# Patient Record
Sex: Female | Born: 1935 | Race: White | Hispanic: Yes | Smoking: Never smoker
Health system: Southern US, Community
[De-identification: ages and names within clinical notes are randomized; demographics above are authoritative.]

## PROBLEM LIST (undated history)

## (undated) DIAGNOSIS — S3210XA Unspecified fracture of sacrum, initial encounter for closed fracture: Secondary | ICD-10-CM

## (undated) DIAGNOSIS — I10 Essential (primary) hypertension: Secondary | ICD-10-CM

## (undated) DIAGNOSIS — S329XXA Fracture of unspecified parts of lumbosacral spine and pelvis, initial encounter for closed fracture: Secondary | ICD-10-CM

## (undated) HISTORY — DX: Essential (primary) hypertension: I10

## (undated) HISTORY — PX: HIP FRACTURE SURGERY: SHX118

## (undated) HISTORY — DX: Unspecified fracture of sacrum, initial encounter for closed fracture: S32.10XA

## (undated) HISTORY — DX: Fracture of unspecified parts of lumbosacral spine and pelvis, initial encounter for closed fracture: S32.9XXA

---

## 2016-07-02 ENCOUNTER — Other Ambulatory Visit (HOSPITAL_COMMUNITY): Payer: Self-pay | Admitting: Internal Medicine

## 2016-07-02 DIAGNOSIS — R1319 Other dysphagia: Secondary | ICD-10-CM

## 2016-07-11 ENCOUNTER — Ambulatory Visit (HOSPITAL_COMMUNITY)
Admission: RE | Admit: 2016-07-11 | Discharge: 2016-07-11 | Disposition: A | Discharge status: Home / Self Care | Payer: Medicare Other | Source: Ambulatory Visit | Attending: Internal Medicine | Admitting: Internal Medicine

## 2016-07-11 DIAGNOSIS — I10 Essential (primary) hypertension: Secondary | ICD-10-CM | POA: Diagnosis not present

## 2016-07-11 DIAGNOSIS — G309 Alzheimer's disease, unspecified: Secondary | ICD-10-CM | POA: Diagnosis not present

## 2016-07-11 DIAGNOSIS — R1319 Other dysphagia: Secondary | ICD-10-CM

## 2016-07-11 DIAGNOSIS — F329 Major depressive disorder, single episode, unspecified: Secondary | ICD-10-CM | POA: Diagnosis not present

## 2016-07-11 DIAGNOSIS — F419 Anxiety disorder, unspecified: Secondary | ICD-10-CM | POA: Insufficient documentation

## 2016-07-11 DIAGNOSIS — Z8701 Personal history of pneumonia (recurrent): Secondary | ICD-10-CM | POA: Diagnosis not present

## 2016-07-11 DIAGNOSIS — F0280 Dementia in other diseases classified elsewhere without behavioral disturbance: Secondary | ICD-10-CM | POA: Diagnosis not present

## 2016-07-11 DIAGNOSIS — D649 Anemia, unspecified: Secondary | ICD-10-CM | POA: Diagnosis not present

## 2016-07-11 DIAGNOSIS — R1310 Dysphagia, unspecified: Secondary | ICD-10-CM | POA: Insufficient documentation

## 2016-07-11 DIAGNOSIS — F0390 Unspecified dementia without behavioral disturbance: Secondary | ICD-10-CM | POA: Diagnosis present

## 2016-07-11 NOTE — Progress Notes (Signed)
Modified Barium Swallow Progress Note  Patient Details  Name: Tamara BarefootGenoveva Christensen MRN: [id number] Date of Birth: 08-19-1923  Today's Date: 07/11/2016  Modified Barium Swallow completed.  Full report located under Chart Review in the Imaging Section.  Brief recommendations include the following:  Clinical Impression  Pt presents with a mild oral dysphagia, likely multifactorial in nature due to cognitive status as well as missing lower dentures. She has mildly prolonged mastication, reduced bolus cohesion, and oral clearance, although she swallows her oral residuals from soft solids with Mod I. Pt has intermittent flash penetration of thin liquids, which is not necessarily abnormal given her advanced age. Even when challenged with large, consecutive straw sips, there is no frank penetration or aspiration. Pt certainly seems appropriate for advancement of her solid foods. Would recommend a chopped or mechanical soft diet, but will defer to her primary SLP's discretion. Thin liquids could be continued, and would continu to crush her medications in puree as pt made no attempts to swallow a pill whole.    Swallow Evaluation Recommendations       SLP Diet Recommendations: Dysphagia 3 (Mech soft) solids;Dysphagia 2 (Fine chop) solids;Thin liquid   Liquid Administration via: Cup;Straw   Medication Administration: Crushed with puree   Supervision: Patient able to self feed;Full supervision/cueing for compensatory strategies   Compensations: Minimize environmental distractions;Slow rate;Small sips/bites   Postural Changes: Seated upright at 90 degrees   Oral Care Recommendations: Oral care BID        Maxcine Hamaiewonsky, Joaquim Tolen 07/11/2016,1:55 PM   Maxcine HamLaura Paiewonsky, M.A. CCC-SLP [phone]

## 2016-10-23 ENCOUNTER — Encounter: Payer: Self-pay | Admitting: Adult Health

## 2016-10-23 ENCOUNTER — Ambulatory Visit (INDEPENDENT_AMBULATORY_CARE_PROVIDER_SITE_OTHER): Payer: Medicare Other | Admitting: Adult Health

## 2016-10-23 VITALS — BP 125/67 | HR 71 | Wt 84.7 lb

## 2016-10-23 DIAGNOSIS — G308 Other Alzheimer's disease: Secondary | ICD-10-CM

## 2016-10-23 DIAGNOSIS — Z0000 Encounter for general adult medical examination without abnormal findings: Secondary | ICD-10-CM | POA: Diagnosis not present

## 2016-10-23 DIAGNOSIS — I10 Essential (primary) hypertension: Secondary | ICD-10-CM

## 2016-10-23 DIAGNOSIS — F0281 Dementia in other diseases classified elsewhere with behavioral disturbance: Secondary | ICD-10-CM

## 2016-10-23 DIAGNOSIS — F0280 Dementia in other diseases classified elsewhere without behavioral disturbance: Secondary | ICD-10-CM | POA: Insufficient documentation

## 2016-10-23 DIAGNOSIS — G309 Alzheimer's disease, unspecified: Secondary | ICD-10-CM

## 2016-10-23 NOTE — Patient Instructions (Addendum)
Alzheimer Disease Alzheimer disease is a brain disease that affects memory, thinking, and behavior. People with Alzheimer disease lose mental abilities, and the disease gets worse over time. Survival with Alzheimer disease ranges from several years to as long as 20 years. What are the causes? This condition develops when a protein called beta-amyloid forms deposits in the brain. It is not known what causes these deposits to form. What increases the risk? This condition is more likely to develop in people who:  Are elderly.  Have a family history of dementia.  Have had a brain injury.  Have heart or blood vessel disease.  Have had a stroke.  Have high blood pressure or high cholesterol.  Have diabetes. What are the signs or symptoms? Symptoms of this condition happen in three stages, which often overlap. Early stage In this stage, you may continue to be independent. You may still be able to drive, work, and be social. Symptoms in this stage include:  Minor memory problems, such as forgetting a name or what you read.  Difficulty with:  Paying attention.  Communicating.  Doing familiar tasks.  Learning new things.  Needing more time to do daily activities.  Anxiety.  Social withdrawal.  Loss of motivation. Moderate stage In this stage, you will start to need care. This stage usually lasts the longest. Symptoms in this stage include:  Difficulty with expressing thoughts.  Memory loss that affects daily life. This can include forgetting:  Your address or phone number.  Events that have happened.  Parts of your personal history, like where you went to school.  Confusion about where you are or what time it is.  Difficulty in judging distance.  Changes in personality, mood, and behavior. You may be moody, irritable, angry, frustrated, fearful, anxious, or suspicious.  Poor reasoning and judgment.  Delusions or hallucinations.  Changes in sleep  patterns.  Wandering and getting lost. Severe stage In the final stage, you will need help with your personal care and dailyactivities. Symptoms in this stage include:  Worsening memory loss.  Personality changes.  Loss of awareness of your surroundings.  Changes in physical abilities, including the ability to walk, sit, and swallow.  Difficulty in communicating.  Inability to control the bladder and bowels.  Increasing confusion.  Increasing disruptive behavior. How is this diagnosed? This condition is diagnosed with an assessment by your health care provider. During this assessment, your health care provider will talk with you and your family, friends, or caregivers about your symptoms. A thorough medical history will be taken, and you will have a physical exam and tests. Tests may include:  Lab tests, such as blood or urine tests.  Imaging tests, such as a CT scan, PET scan, or MRI.  A lumbar puncture. This test involves removing and testing a small amount of the fluid that surrounds the brain and spinal cord.  An electroencephalogram (EEG). In this test, small metal discs are used to measure electrical activity in the brain.  Memory tests, cognitive tests, and neuropsychological tests. These tests evaluate brain function. How is this treated? At this time, there is no treatment to cure Alzheimer disease or stop it from getting worse. The goals of treatment are:  To slow down the disease.  To manage behavioral problems.  To provide you with a safe environment.  To make life easier for you and your caregivers. The following treatment options are available:  Medicines. Medicines may help to slow down memory loss and control behavioral symptoms.    memory loss and control behavioral symptoms.  Talk therapy. Talk therapy provides you with education, support, and memory aids. It is most helpful in the early stages of the condition.  Counseling or spiritual guidance. It is normal to have a lot of feelings,  including anger, relief, fear, and isolation. Counseling and guidance can help you deal with these feelings.  Caregiving. This involves having caregivers help you with your daily activities. Caregivers may be family members, friends, or trained medical professionals. Caregiving can be done at home or outside the home.  Family support groups. These provide education, emotional support, and information about community resources to family members who are taking care of you.  Follow these instructions at home: Medicines  Take over-the-counter and prescription medicines only as told by your health care provider.  Avoid taking medicines that can affect thinking, such as pain or sleeping medicines. Lifestyle   Make healthy lifestyle choices: ? Be physically active as told by your health care provider. ? Do not use any tobacco products, such as cigarettes, chewing tobacco, and e-cigarettes. If you need help quitting, ask your health care provider. ? Eat a healthy diet. ? Practice stress-management techniques when you get stressed. ? Stay social.  Drink enough fluid to keep your urine clear or pale yellow.  Make sure to get quality sleep. These tips can help you get a good night's rest: ? Avoid napping during the day. ? Keep your sleeping area dark and cool. ? Avoid exercising during the few hours before you go to bed. ? Avoid caffeine products in the evening. General instructions  Work with your health care provider to determine what you need help with and what your safety needs are.  If you were given a bracelet that tracks your location, make sure to wear it.  Keep all follow-up visits as told by your health care provider. This is important.  If you have questions or would like additional support, you may contact The Alzheimer's Association: ? 24-hour helpline: 385-156-7366 ? Website: LimitLaws.hu Contact a health care provider if:  You have nausea, vomiting, or trouble with  eating.  You have dizziness, or weakness.  You have new or worsening trouble with sleeping.  You or your family members become concerned for your safety. Get help right away if:  You develop chest pain or difficulty with breathing.  You pass out. This information is not intended to replace advice given to you by your health care provider. Make sure you discuss any questions you have with your health care provider. Document Released: 09/13/2003 Document Revised: 09/02/2015 Document Reviewed: 09/29/2014 Elsevier Interactive Patient Education  2017 [employer].  Fall Prevention in the Home Falls can cause injuries and can affect people from all age groups. There are many simple things that you can do to make your home safe and to help prevent falls. What can I do on the outside of my home?  Regularly repair the edges of walkways and driveways and fix any cracks.  Remove high doorway thresholds.  Trim any shrubbery on the main path into your home.  Use bright outdoor lighting.  Clear walkways of debris and clutter, including tools and rocks.  Regularly check that handrails are securely fastened and in good repair. Both sides of any steps should have handrails.  Install guardrails along the edges of any raised decks or porches.  Have leaves, snow, and ice cleared regularly.  Use sand or salt on walkways during winter months.  In the garage, clean  up any spills right away, including grease or oil spills. What can I do in the bathroom?  Use night lights.  Install grab bars by the toilet and in the tub and shower. Do not use towel bars as grab bars.  Use non-skid mats or decals on the floor of the tub or shower.  If you need to sit down while you are in the shower, use a plastic, non-slip stool.  Keep the floor dry. Immediately clean up any water that spills on the floor.  Remove soap buildup in the tub or shower on a regular basis.  Attach bath mats securely with  double-sided non-slip rug tape.  Remove throw rugs and other tripping hazards from the floor. What can I do in the bedroom?  Use night lights.  Make sure that a bedside light is easy to reach.  Do not use oversized bedding that drapes onto the floor.  Have a firm chair that has side arms to use for getting dressed.  Remove throw rugs and other tripping hazards from the floor. What can I do in the kitchen?  Clean up any spills right away.  Avoid walking on wet floors.  Place frequently used items in easy-to-reach places.  If you need to reach for something above you, use a sturdy step stool that has a grab bar.  Keep electrical cables out of the way.  Do not use floor polish or wax that makes floors slippery. If you have to use wax, make sure that it is non-skid floor wax.  Remove throw rugs and other tripping hazards from the floor. What can I do in the stairways?  Do not leave any items on the stairs.  Make sure that there are handrails on both sides of the stairs. Fix handrails that are broken or loose. Make sure that handrails are as long as the stairways.  Check any carpeting to make sure that it is firmly attached to the stairs. Fix any carpet that is loose or worn.  Avoid having throw rugs at the top or bottom of stairways, or secure the rugs with carpet tape to prevent them from moving.  Make sure that you have a light switch at the top of the stairs and the bottom of the stairs. If you do not have them, have them installed. What are some other fall prevention tips?  Wear closed-toe shoes that fit well and support your feet. Wear shoes that have rubber soles or low heels.  When you use a stepladder, make sure that it is completely opened and that the sides are firmly locked. Have someone hold the ladder while you are using it. Do not climb a closed stepladder.  Add color or contrast paint or tape to grab bars and handrails in your home. Place contrasting color  strips on the first and last steps.  Use mobility aids as needed, such as canes, walkers, scooters, and crutches.  Turn on lights if it is dark. Replace any light bulbs that burn out.  Set up furniture so that there are clear paths. Keep the furniture in the same spot.  Fix any uneven floor surfaces.  Choose a carpet design that does not hide the edge of steps of a stairway.  Be aware of any and all pets.  Review your medicines with your healthcare provider. Some medicines can cause dizziness or changes in blood pressure, which increase your risk of falling. Talk with your health care provider about other ways that you can decrease  your risk of falls. This may include working with a physical therapist or trainer to improve your strength, balance, and endurance. This information is not intended to replace advice given to you by your health care provider. Make sure you discuss any questions you have with your health care provider. Document Released: 12/22/2001 Document Revised: 05/31/2015 Document Reviewed: 02/05/2014 Elsevier Interactive Patient Education  2017 [employer].  Please continue all medications as directed. Home Health care referral placed. Please make appt in next few weeks for fasting labs and complete physical. WELCOME TO THE PRACTICE!

## 2016-10-23 NOTE — Progress Notes (Signed)
Subjective:    Patient ID: Tamara Christensen, female    DOB: 1923-05-30, 83 y.o.   MRN: [id number]  HPI:  Tamara Christensen is here to establish as a new pt.  She is a 83 year old female. PMH: Dementia without behavioral disturbance, PNA, anxiety/depression, Alzheimer's, HTN, and anemia.  She underwent modified barium swallow study on 07/11/2016, due to difficulty swallowing pills.  Study revealed small amount of "flash penetration" with thin liquids and minimal post swallow retention with puree foods.   Referral to speech pathology placed.  All hx was obtained from pt's great niece/primary care giver/POA- "[facility]".   The patient was displaced from here home in [place] after [facility] 2017.  She initially moved to [place] and was house in nursing home, then moved down to [place] to be with Tamara Christensen and her family.  She briefly lived at [facility] then Tamara Christensen moved her in with and her husband.  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  She was dx'd with moderate dementia in 2017 and since then has stopped using [place] and only speaks in Spanish now. She has experienced a few falls in the last 1/2 year, most recently Friday- seen/cleared at [place] (no xray completed b/c pt was walking normally and denied any pain).  Tamara Christensen reports that Tamara Christensen has never used tobacco/excessive ETOH.  She is able to feed herself and can ambulate short distances.  She reports that her appetite is "decent, but she can't put on any wt".  [name] is incontinent of urine/stool, however due to EXCELLENT care by Tamara Christensen she is not suffering from any skin breakdown. She is followed by psychiatry every 4 months for treatment of Alzheimer's and anxiety/depression/agitation.   Limited medical hx due to Tamara Christensen having little/no contact with Tamara Christensen while she lived in [place]    Patient Care Team    Relationship Specialty Notifications Start  End  Tamara Christensen, Tamara Blossom, NP PCP - General Family Medicine  10/23/16     Patient Active Problem List   Diagnosis Date Noted  . Alzheimer disease 10/23/2016  . Healthcare maintenance 10/23/2016  . Hypertension 10/23/2016     Past Medical History:  Diagnosis Date  . Fracture of sacrum (HCC)   . Hypertension   . Pelvic fracture [facility])      Past Surgical History:  Procedure Laterality Date  . HIP FRACTURE SURGERY       Family History  Problem Relation Age of Onset  . Hypertension Mother   . Stroke Mother   . Hypertension Father      History  Drug Use No     History  Alcohol Use No     History  Smoking Status  . Never Smoker  Smokeless Tobacco  . Never Used     Outpatient Encounter Prescriptions as of 10/23/2016  Medication Sig  . acetaminophen (TYLENOL) 325 MG tablet Take 1 tablet by mouth daily.  Marland Kitchen amLODipine (NORVASC) 5 MG tablet Take 1 tablet by mouth daily.  . calcium carbonate (OS-CAL) 600 MG tablet Take 1 tablet by mouth daily.  . divalproex (DEPAKOTE) 125 MG DR tablet Take 1 tablet by mouth 2 (two) times daily.  Marland Kitchen escitalopram (LEXAPRO) 10 MG tablet Take 1 tablet by mouth daily.  Marland Kitchen LORazepam (ATIVAN) 0.5 MG tablet Take 1 tablet by mouth 2 (two) times daily.  Marland Kitchen losartan (COZAAR) 25 MG tablet Take 1  tablet by mouth daily.  . Melatonin 5 MG CAPS Take 1 tablet by mouth daily.  . Multiple Vitamins-Minerals (MULTIVITAMIN ADULT PO) Take 1 tablet by mouth daily.  . QUEtiapine (SEROQUEL) 25 MG tablet Take 1 tablet by mouth daily.   No facility-administered encounter medications on file as of 10/23/2016.     Allergies: Patient has no known allergies.  There is no height or weight on file to calculate BMI.  Blood pressure 125/67, pulse 71, weight 84 lb 11.2 oz (38.4 kg).  Review of Systems  Constitutional: Positive for activity change and fatigue. Negative for appetite change, chills, diaphoresis, fever and unexpected weight change.  Eyes: Positive for  visual disturbance.  Respiratory: Negative for cough, chest tightness, shortness of breath, wheezing and stridor.   Cardiovascular: Negative for chest pain, palpitations and leg swelling.  Gastrointestinal: Negative for abdominal distention, abdominal pain, blood in stool, constipation, diarrhea, nausea and vomiting.       Bowel/bladder incontinence  Endocrine: Negative for cold intolerance, heat intolerance, polydipsia, polyphagia and polyuria.  Genitourinary: Negative for difficulty urinating, frequency, hematuria and pelvic pain.  Musculoskeletal: Positive for arthralgias, back pain, gait problem, joint swelling and myalgias. Negative for neck pain and neck stiffness.  Skin: Negative for color change, pallor, rash and wound.  Neurological: Negative for dizziness and headaches.  Hematological: Does not bruise/bleed easily.  Psychiatric/Behavioral: Positive for confusion and dysphoric mood. Negative for agitation, behavioral problems, self-injury, sleep disturbance and suicidal ideas. The patient is nervous/anxious.        Objective:   Physical Exam  Constitutional: She appears well-developed and well-nourished. No distress.  HENT:  Head: Normocephalic and atraumatic.  Right Ear: External ear normal. Decreased hearing is noted.  Left Ear: External ear normal. Decreased hearing is noted.  Cardiovascular: Normal rate, regular rhythm and intact distal pulses.   Murmur heard. Pulmonary/Chest: Effort normal and breath sounds normal. No respiratory distress. She has no wheezes. She has no rales. She exhibits no tenderness.  Musculoskeletal: She exhibits edema and tenderness.  Neurological: She is alert.  Skin: Skin is warm and dry. She is not diaphoretic. There is erythema.     Area of erythema-middle/occipital skull. No open tissue/drainage noted.   Psychiatric: Her affect is blunt. She is withdrawn.  Pt sat in wheelchair and hummed to herself during OV. She did make eye contact and nod  when appropriate.  Nursing note and vitals reviewed.         Assessment & Plan:   1. 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 Healthcare maintenance   3. Hypertension, unspecified type     Hypertension BP at goal 125/67, HR 71 Continue losartan  and amlodipine  daily.    Healthcare maintenance Please continue all medications as directed. Home Health care referral placed. Please make appt in next few weeks for fasting labs and complete physical.  Spent >45 minutes with pt and pt's POA to complete H/P and review previous medical records that were available.   FOLLOW-UP:  Return in about 2 weeks (around 11/06/2016) for CPE, Fasting Lab Draw.

## 2016-10-23 NOTE — Assessment & Plan Note (Signed)
BP at goal 125/67, HR 71 Continue losartan  and amlodipine  daily.

## 2016-10-23 NOTE — Assessment & Plan Note (Signed)
Please continue all medications as directed. Home Health care referral placed. Please make appt in next few weeks for fasting labs and complete physical.

## 2016-10-24 ENCOUNTER — Telehealth: Payer: Self-pay | Admitting: Adult Health

## 2016-10-24 NOTE — Telephone Encounter (Signed)
10/24/16 rcvd a call from  [facility]/ [phone] stating they needed to clarify  If pt is to be receiving (home health or Hospice care) --- --- Sharyl Nimrod state pt's Niece & POA stated she wanted hospice care --- please call [phone] with any questions. --glh   Later in the day rcvd a 2nd call frm Sharyl Nimrod stating that, since they are " Not " a hospice service that their office cannot provide the service pt's niece is requesting.

## 2016-10-24 NOTE — Telephone Encounter (Signed)
Please advise.  T. Rondle Lohse, CMA 

## 2016-10-24 NOTE — Telephone Encounter (Signed)
Home health care please

## 2016-10-25 NOTE — Telephone Encounter (Signed)
Spoke with Sharyl Nimrod @ [facility] and informed her we are requesting home health.  Sharyl Nimrod stated that pt's niece is requesting Hospice and asked that I call niece to discuss.    Spoke with [employer] who states that Pollyann Glen with [employer] is coming today to do an evaluation of pt's needs.  Advised [name] to have [employer] forward the evaluation to our office and we can then order any appropriate service.  Tiajuana Amass, CMA

## 2016-10-30 ENCOUNTER — Other Ambulatory Visit: Payer: Medicare Other

## 2016-11-06 ENCOUNTER — Encounter: Payer: Medicare Other | Admitting: Adult Health

## 2016-11-20 ENCOUNTER — Ambulatory Visit: Payer: Self-pay | Admitting: Internal Medicine

## 2017-05-15 DEATH — deceased

## 2018-01-19 IMAGING — RF DG SWALLOWING FUNCTION
15 of 18 series · 19 of 24 positions shown · non-contrast
Comparison: None.

CLINICAL DATA: [AGE] female with difficulty swallowing.
Frequent spitting.

EXAM:
MODIFIED BARIUM SWALLOW
TECHNIQUE: Different consistencies of barium were administered orally to the
patient by the Speech Pathologist. Imaging of the pharynx was
performed in the lateral projection.
FLUOROSCOPY TIME:  Fluoroscopy Time:  1 minutes and 10 seconds

[Series 1: run · 1 of 1 slices shown (1 of 15)]
[im 1/1]
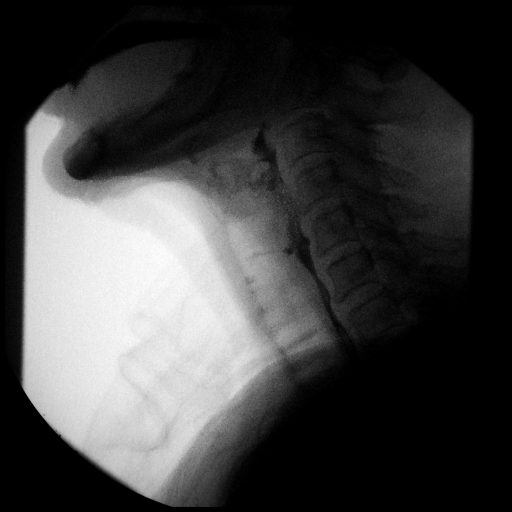

[Series 2: run · 1 of 1 slices shown (2 of 15)]
[im 1/1]
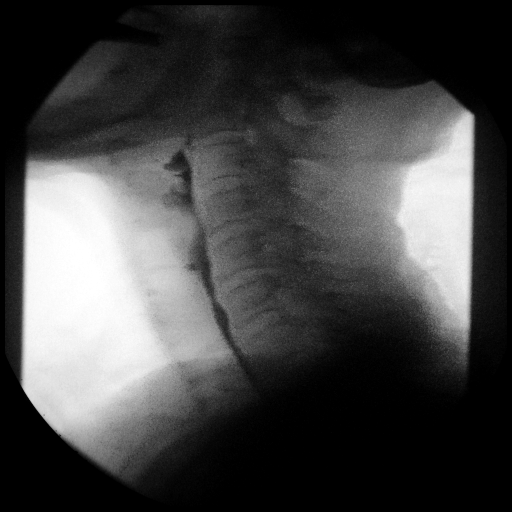

[Series 4: run · 1 of 1 slices shown (3 of 15)]
[im 1/1]
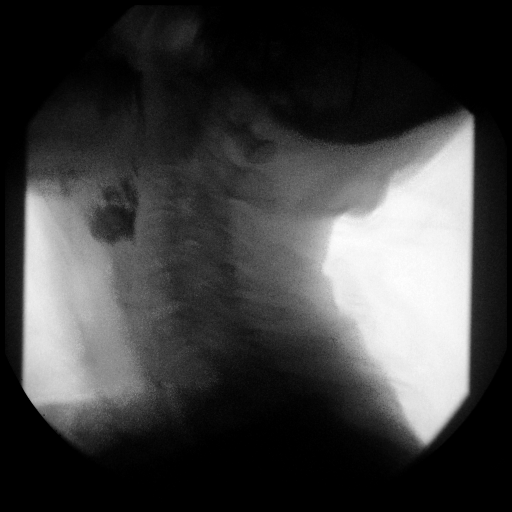

[Series 5: run · 1 of 1 slices shown (4 of 15)]
[im 1/1]
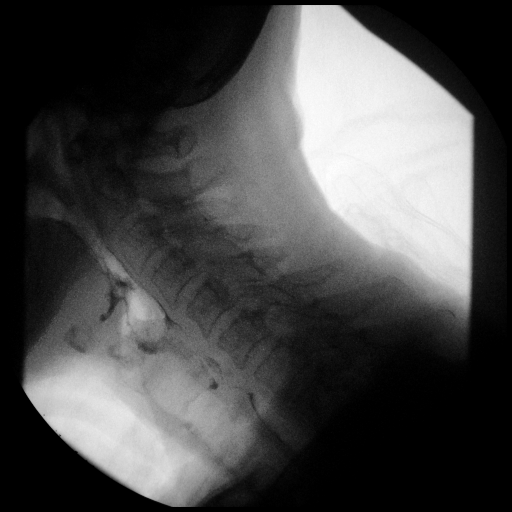

[Series 6: run · 1 of 1 slices shown (5 of 15)]
[im 1/1]
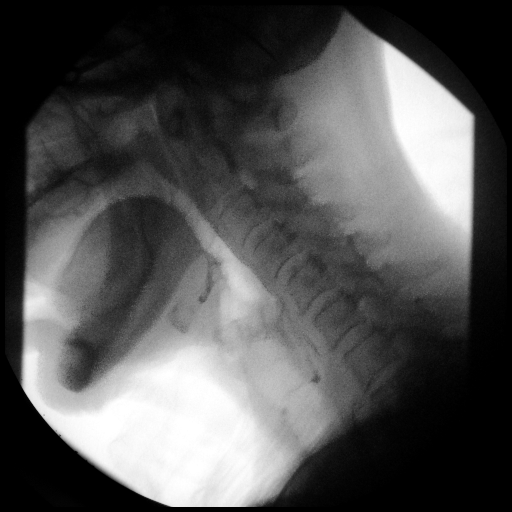

[Series 7: run · 5 of 47 slices shown (6 of 15)]
[im 1/47]
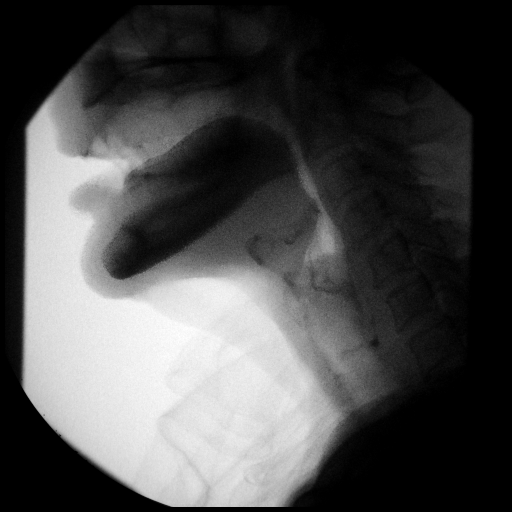
[im 16/47]
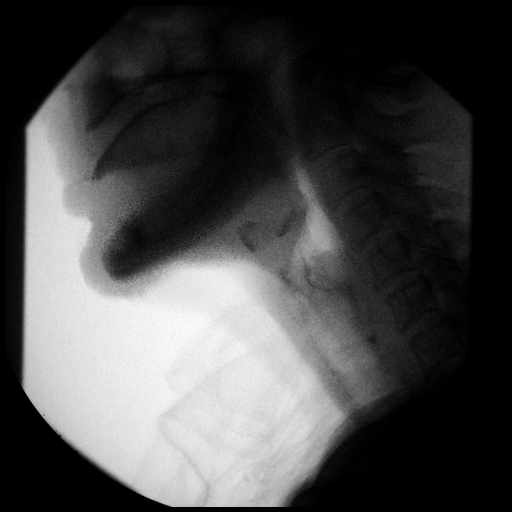
[im 24/47]
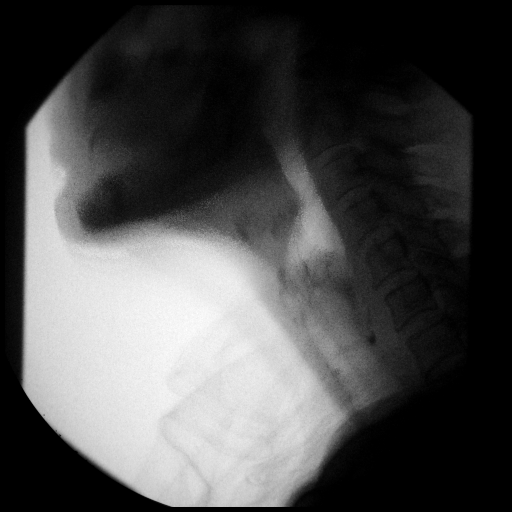
[im 31/47]
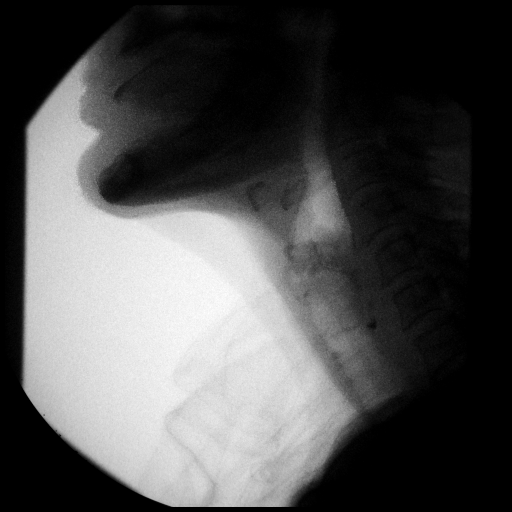
[im 47/47]
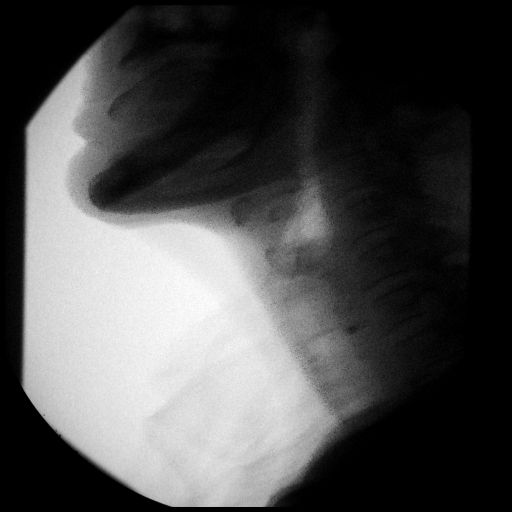

[Series 8: run · 1 of 1 slices shown (7 of 15)]
[im 1/1]
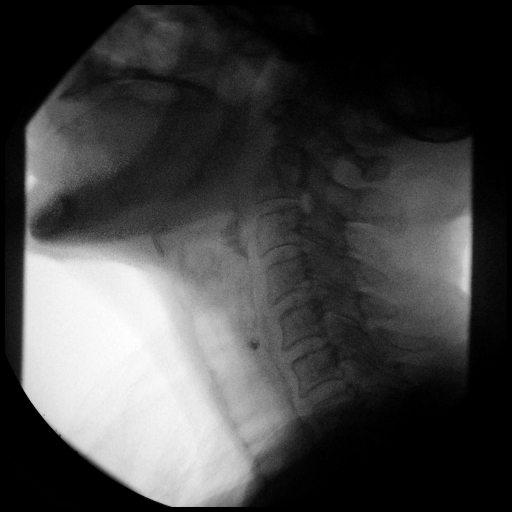

[Series 9: run · 1 of 1 slices shown (8 of 15)]
[im 1/1]
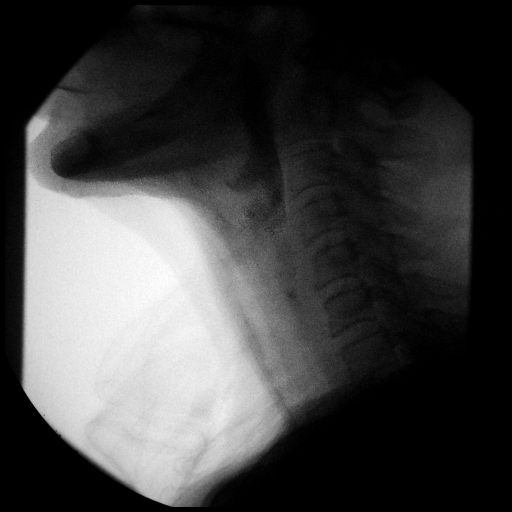

[Series 10: run · 1 of 1 slices shown (9 of 15)]
[im 1/1]
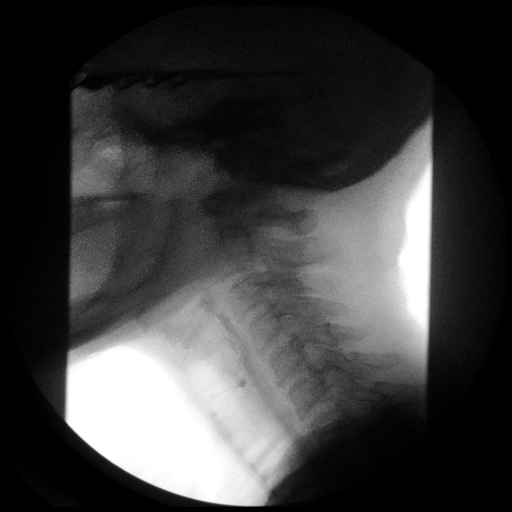

[Series 12: run · 1 of 1 slices shown (10 of 15)]
[im 1/1]
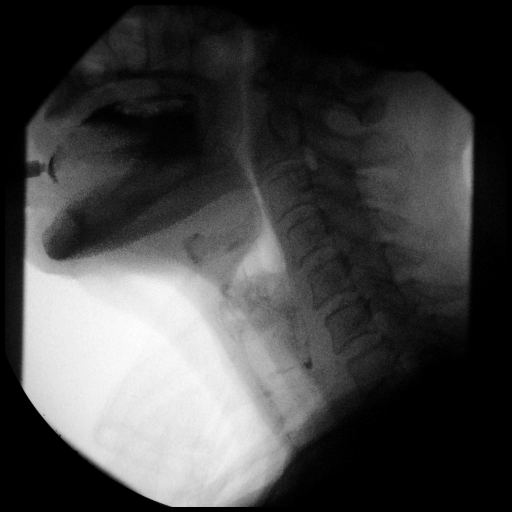

[Series 13: run · 1 of 1 slices shown (11 of 15)]
[im 1/1]
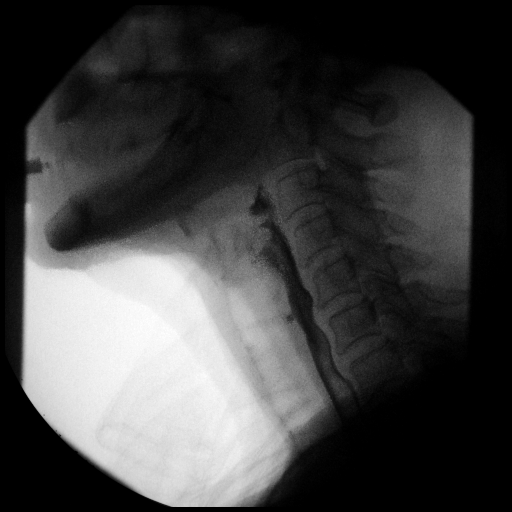

[Series 14: run · 1 of 1 slices shown (12 of 15)]
[im 1/1]
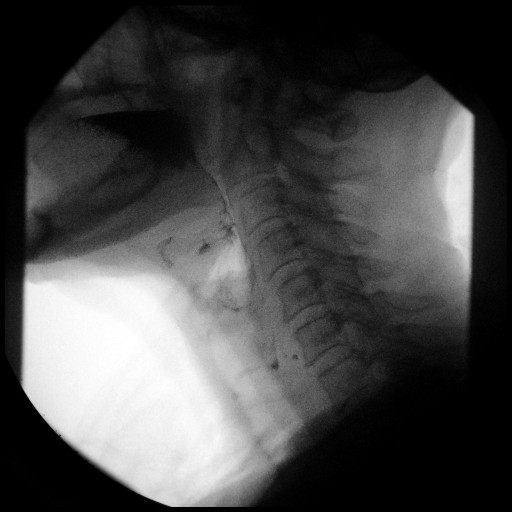

[Series 15: run · 1 of 1 slices shown (13 of 15)]
[im 1/1]
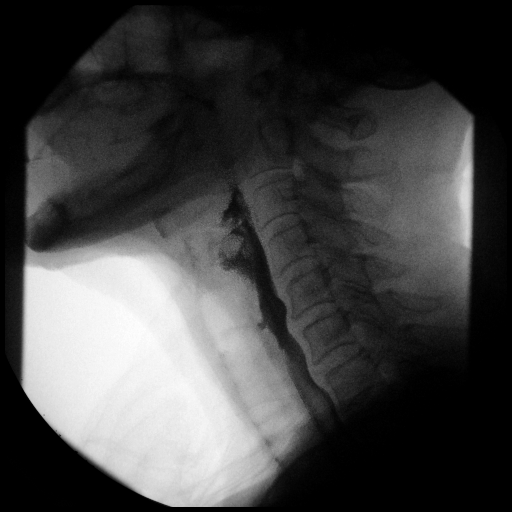

[Series 17: run · 1 of 1 slices shown (14 of 15)]
[im 1/1]
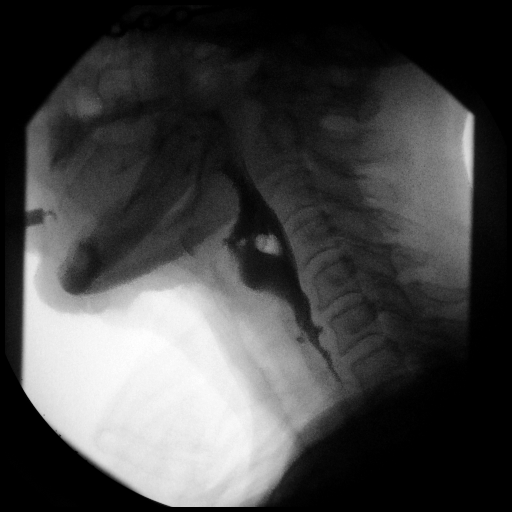

[Series 18: run · 1 of 1 slices shown (15 of 15)]
[im 1/1]
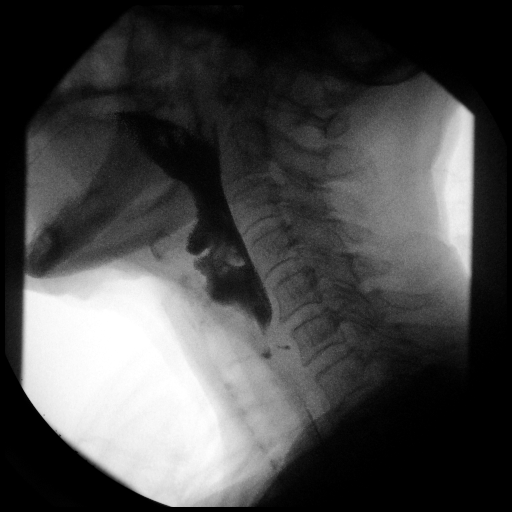

[19 of 24 positions shown; findings below may reference images not displayed]

FINDINGS: Thin liquid- Small amount of flash penetration.  Otherwise normal.

Gedor?Comak Minimal post swallow retention.  Otherwise normal.

Gedor?Nao with cracker- Within normal limits
IMPRESSION: 1. Swallow study findings, as above.

Please refer to the Speech Pathologists report for complete details
and recommendations.
